# Patient Record
Sex: Female | Born: 1985 | Race: Black or African American | Hispanic: No | Marital: Married | Smoking: Never smoker
Health system: Southern US, Community
[De-identification: ages and names within clinical notes are randomized; demographics above are authoritative.]

## PROBLEM LIST (undated history)

## (undated) DIAGNOSIS — J45909 Unspecified asthma, uncomplicated: Secondary | ICD-10-CM

## (undated) HISTORY — PX: TONSILLECTOMY: SUR1361

---

## 2013-01-09 ENCOUNTER — Encounter (HOSPITAL_COMMUNITY): Payer: Self-pay | Admitting: *Deleted

## 2013-01-09 ENCOUNTER — Emergency Department (HOSPITAL_COMMUNITY)
Admission: EM | Admit: 2013-01-09 | Discharge: 2013-01-09 | Disposition: A | Discharge status: Home / Self Care | Payer: BC Managed Care – PPO | Attending: Emergency Medicine | Admitting: Emergency Medicine

## 2013-01-09 ENCOUNTER — Emergency Department (HOSPITAL_COMMUNITY): Payer: BC Managed Care – PPO

## 2013-01-09 DIAGNOSIS — Y92410 Unspecified street and highway as the place of occurrence of the external cause: Secondary | ICD-10-CM | POA: Insufficient documentation

## 2013-01-09 DIAGNOSIS — T148XXA Other injury of unspecified body region, initial encounter: Secondary | ICD-10-CM

## 2013-01-09 DIAGNOSIS — Y9389 Activity, other specified: Secondary | ICD-10-CM | POA: Insufficient documentation

## 2013-01-09 DIAGNOSIS — Z3202 Encounter for pregnancy test, result negative: Secondary | ICD-10-CM | POA: Insufficient documentation

## 2013-01-09 DIAGNOSIS — S8990XA Unspecified injury of unspecified lower leg, initial encounter: Secondary | ICD-10-CM | POA: Insufficient documentation

## 2013-01-09 DIAGNOSIS — J45909 Unspecified asthma, uncomplicated: Secondary | ICD-10-CM | POA: Insufficient documentation

## 2013-01-09 HISTORY — DX: Unspecified asthma, uncomplicated: J45.909

## 2013-01-09 LAB — PREGNANCY, URINE: Preg Test, Ur: NEGATIVE

## 2013-01-09 MED ORDER — HYDROCODONE-ACETAMINOPHEN 5-325 MG PO TABS: 1.0000 | ORAL_TABLET | Freq: Four times a day (QID) | ORAL | Status: AC | PRN

## 2013-01-09 MED ORDER — HYDROCODONE-ACETAMINOPHEN 5-325 MG PO TABS
2.0000 | ORAL_TABLET | Freq: Once | ORAL | Status: AC
  Administered 2013-01-09: 2 via ORAL
  Filled 2013-01-09: qty 2

## 2013-01-09 MED ORDER — IBUPROFEN 600 MG PO TABS: 600.0000 mg | ORAL_TABLET | Freq: Four times a day (QID) | ORAL | Status: AC | PRN
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
## 2014-08-31 DIAGNOSIS — J4520 Mild intermittent asthma, uncomplicated: Secondary | ICD-10-CM | POA: Insufficient documentation

## 2014-08-31 DIAGNOSIS — Z6841 Body Mass Index (BMI) 40.0 and over, adult: Secondary | ICD-10-CM | POA: Insufficient documentation

## 2020-04-04 DIAGNOSIS — K219 Gastro-esophageal reflux disease without esophagitis: Secondary | ICD-10-CM | POA: Insufficient documentation

## 2021-01-10 ENCOUNTER — Ambulatory Visit: Payer: BC Managed Care – PPO | Admitting: Internal Medicine

## 2021-01-10 ENCOUNTER — Other Ambulatory Visit: Payer: Self-pay

## 2021-01-10 DIAGNOSIS — Z91199 Patient's noncompliance with other medical treatment and regimen due to unspecified reason: Secondary | ICD-10-CM

## 2021-01-10 DIAGNOSIS — Z5329 Procedure and treatment not carried out because of patient's decision for other reasons: Secondary | ICD-10-CM

## 2021-01-12 NOTE — Progress Notes (Signed)
Patient was no-show for appointment.  The office staff will contact the patient for rescheduling follow-up. 

## 2021-01-16 NOTE — Progress Notes (Signed)
No show for appointment. Office will call to reschedule.  

## 2021-01-17 ENCOUNTER — Ambulatory Visit: Payer: BC Managed Care – PPO | Admitting: Internal Medicine
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
## 2021-01-25 ENCOUNTER — Ambulatory Visit (INDEPENDENT_AMBULATORY_CARE_PROVIDER_SITE_OTHER): Payer: BC Managed Care – PPO | Admitting: Internal Medicine

## 2021-01-25 DIAGNOSIS — G4733 Obstructive sleep apnea (adult) (pediatric): Secondary | ICD-10-CM

## 2021-01-25 DIAGNOSIS — Z6841 Body Mass Index (BMI) 40.0 and over, adult: Secondary | ICD-10-CM

## 2021-01-25 DIAGNOSIS — I10 Essential (primary) hypertension: Secondary | ICD-10-CM

## 2021-01-25 DIAGNOSIS — F419 Anxiety disorder, unspecified: Secondary | ICD-10-CM

## 2021-01-25 DIAGNOSIS — F32A Depression, unspecified: Secondary | ICD-10-CM

## 2021-01-25 DIAGNOSIS — J4520 Mild intermittent asthma, uncomplicated: Secondary | ICD-10-CM

## 2021-01-25 DIAGNOSIS — K219 Gastro-esophageal reflux disease without esophagitis: Secondary | ICD-10-CM
